# Patient Record
Sex: Male | Born: 1980 | Race: White | Hispanic: No | Marital: Single | Smoking: Never smoker
Health system: Southern US, Community
[De-identification: ages and names within clinical notes are randomized; demographics above are authoritative.]

## PROBLEM LIST (undated history)

## (undated) DIAGNOSIS — Z8619 Personal history of other infectious and parasitic diseases: Secondary | ICD-10-CM

## (undated) HISTORY — DX: Personal history of other infectious and parasitic diseases: Z86.19

## (undated) HISTORY — PX: NO PAST SURGERIES: SHX2092

---

## 2012-10-28 ENCOUNTER — Ambulatory Visit: Payer: Self-pay | Admitting: Family Medicine

## 2012-12-25 ENCOUNTER — Encounter: Payer: Self-pay | Admitting: Family Medicine

## 2012-12-25 ENCOUNTER — Ambulatory Visit (INDEPENDENT_AMBULATORY_CARE_PROVIDER_SITE_OTHER): Payer: Managed Care, Other (non HMO) | Admitting: Family Medicine

## 2012-12-25 VITALS — BP 122/78 | HR 64 | Temp 98.6°F | Ht 71.0 in | Wt 212.8 lb

## 2012-12-25 DIAGNOSIS — Z113 Encounter for screening for infections with a predominantly sexual mode of transmission: Secondary | ICD-10-CM

## 2012-12-25 DIAGNOSIS — R1031 Right lower quadrant pain: Secondary | ICD-10-CM

## 2012-12-25 DIAGNOSIS — Z0000 Encounter for general adult medical examination without abnormal findings: Secondary | ICD-10-CM | POA: Insufficient documentation

## 2012-12-25 LAB — BASIC METABOLIC PANEL
BUN: 21 mg/dL (ref 6–23)
CO2: 32 mEq/L (ref 19–32)
Chloride: 105 mEq/L (ref 96–112)
Creatinine, Ser: 1.2 mg/dL (ref 0.4–1.5)
Glucose, Bld: 105 mg/dL — ABNORMAL HIGH (ref 70–99)

## 2012-12-25 LAB — LIPID PANEL
Cholesterol: 99 mg/dL (ref 0–200)
HDL: 33.9 mg/dL — ABNORMAL LOW (ref >39.00)
Total CHOL/HDL Ratio: 3
Triglycerides: 57 mg/dL (ref 0.0–149.0)

## 2012-12-25 LAB — POCT URINALYSIS DIPSTICK
Glucose, UA: NEGATIVE
Ketones, UA: NEGATIVE
Leukocytes, UA: NEGATIVE

## 2012-12-25 NOTE — Progress Notes (Signed)
Subjective:    Patient ID: Omar Graham, male    DOB: 09-May-1981, 32 y.o.   MRN: [id number]  HPI CC: new pt to establish, would like CPE  Some lower abd discomfort on right, intermittent.  Over last 1 month noticed this.  Denies recent straining.  Does heavy lifting at work.  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 No radiation of pain.  Denies bulge.  No aggravating or alleviating factors noted.  Currently no pain.  Currently sexually active - 3 partners in the last year.  Not using 100% protection.  Denies urethral discharge, dysuria or other urinary symptoms.  Fiancee on birth control.  Requests STD testing today.  Preventative: Fasting today.   Thinks Tdap a few years ago  Sunscreen use discussed. Seatbelt use discussed.  Lives with fiance, no pets Occupation: Engineer, agricultural, receiving Edu: master's Activity: no regular exercise, was going to gym Diet: good water, fruits/vegetables daily  Medications and allergies reviewed and updated in chart.  Past histories reviewed and updated if relevant as below. There are no active problems to display for this patient.  Past Medical History  Diagnosis Date  . History of chicken pox    Past Surgical History  Procedure Laterality Date  . No past surgeries     History  Substance Use Topics  . Smoking status: Never Smoker   . Smokeless tobacco: Never Used  . Alcohol Use: Yes     Comment: Occasional   Family History  Problem Relation Age of Onset  . Cancer Paternal Grandmother     breast  . Cancer Maternal Aunt     breast  . Cancer Paternal Uncle     unsure type  . Diabetes Neg Hx   . Hypertension Neg Hx   . CAD Neg Hx   . Stroke Neg Hx    No Known Allergies No current outpatient prescriptions on file prior to visit.   No current facility-administered medications on file prior to visit.     Review of Systems  Constitutional: Negative for fever, chills, activity change, appetite change, fatigue and unexpected weight  change.  HENT: Negative for hearing loss and neck pain.   Eyes: Negative for visual disturbance.  Respiratory: Negative for cough, chest tightness, shortness of breath and wheezing.   Cardiovascular: Negative for chest pain, palpitations and leg swelling.  Gastrointestinal: Positive for abdominal pain (RLQ pain). Negative for nausea, vomiting, diarrhea, constipation, blood in stool and abdominal distention.  Genitourinary: Negative for hematuria and difficulty urinating.  Musculoskeletal: Negative for myalgias and arthralgias.  Skin: Negative for rash.  Neurological: Negative for dizziness, seizures, syncope and headaches.  Hematological: Negative for adenopathy. Does not bruise/bleed easily.  Psychiatric/Behavioral: Negative for dysphoric mood. The patient is not nervous/anxious.        Objective:   Physical Exam  Nursing note and vitals reviewed. Constitutional: He is oriented to person, place, and time. He appears well-developed and well-nourished. No distress.  HENT:  Head: Normocephalic and atraumatic.  Nose: Nose normal.  Mouth/Throat: Oropharynx is clear and moist. No oropharyngeal exudate.  Eyes: Conjunctivae and EOM are normal. Pupils are equal, round, and reactive to light. No scleral icterus.  Neck: Normal range of motion. Neck supple. No thyromegaly present.  Cardiovascular: Normal rate, regular rhythm, normal heart sounds and intact distal pulses.   No murmur heard. Pulses:      Radial pulses are 2+ on the right side, and 2+ on the left side.  Pulmonary/Chest: Effort normal and breath sounds normal. No respiratory  distress. He has no wheezes. He has no rales.  Abdominal: Soft. Bowel sounds are normal. He exhibits no distension and no mass. There is no tenderness. There is no rebound and no guarding. Hernia confirmed negative in the right inguinal area and confirmed negative in the left inguinal area.  Genitourinary: Penis normal. Right testis shows no mass, no swelling and  no tenderness. Right testis is descended. Left testis shows mass (nontender swelling of epididymis on left). Left testis shows no swelling and no tenderness. Left testis is descended. Uncircumcised.  Musculoskeletal: Normal range of motion. He exhibits no edema.  Lymphadenopathy:    He has no cervical adenopathy.       Right: No inguinal adenopathy present.       Left: Inguinal adenopathy present.  Neurological: He is alert and oriented to person, place, and time.  CN grossly intact, station and gait intact  Skin: Skin is warm and dry. No rash noted.  Psychiatric: He has a normal mood and affect. His behavior is normal. Judgment and thought content normal.      Assessment & Plan:

## 2012-12-25 NOTE — Patient Instructions (Addendum)
Check with health department to see what shot it was and when it was given - then let me know. (check if tetanus if it was Tdap or Td). Blood work and urine checked today. Good to meet you today, call us with questions. Return as needed or in 2-3 for another physical.

## 2012-12-26 LAB — HIV ANTIBODY (ROUTINE TESTING W REFLEX): HIV: NONREACTIVE

## 2012-12-26 LAB — RPR

## 2012-12-27 DIAGNOSIS — R1031 Right lower quadrant pain: Secondary | ICD-10-CM | POA: Insufficient documentation

## 2012-12-27 NOTE — Assessment & Plan Note (Addendum)
Preventative protocols reviewed and updated unless pt declined. Discussed healthy diet and lifestyle.  STD screen today. Pt will check on Tdap status (thinks received at health department)

## 2012-12-27 NOTE — Assessment & Plan Note (Addendum)
RLQ/R groin discomfort along with possible L inguinal lAD and nontender swelling of L epididymis. Suspicious for epididymitis - will check STDs, UA, UCx and consider empiric cipro course to treat possible epididymitis. However, will await STD screen. UA today with moderate blood, but micro reassuring.

## 2012-12-29 LAB — GC/CHLAMYDIA PROBE AMP, URINE: GC Probe Amp, Urine: NEGATIVE

## 2013-06-01 ENCOUNTER — Telehealth: Payer: Self-pay | Admitting: Family Medicine

## 2013-06-01 NOTE — Telephone Encounter (Signed)
Left v/m for pt to cb. 

## 2013-06-01 NOTE — Telephone Encounter (Signed)
Patient called with c/o chest pain.  No answer with return call from RN.

## 2013-06-02 NOTE — Telephone Encounter (Signed)
Has appt for tomorrow

## 2013-06-03 ENCOUNTER — Ambulatory Visit (INDEPENDENT_AMBULATORY_CARE_PROVIDER_SITE_OTHER): Payer: Managed Care, Other (non HMO) | Admitting: Family Medicine

## 2013-06-03 ENCOUNTER — Encounter: Payer: Self-pay | Admitting: Family Medicine

## 2013-06-03 VITALS — BP 110/74 | HR 64 | Temp 98.6°F | Wt 223.0 lb

## 2013-06-03 DIAGNOSIS — R0789 Other chest pain: Secondary | ICD-10-CM | POA: Insufficient documentation

## 2013-06-03 DIAGNOSIS — M940 Chondrocostal junction syndrome [Tietze]: Secondary | ICD-10-CM

## 2013-06-03 MED ORDER — NAPROXEN 500 MG PO TABS: ORAL_TABLET | ORAL | Status: DC

## 2013-06-03 NOTE — Progress Notes (Signed)
Pre-visit discussion using our clinic review tool. No additional management support is needed unless otherwise documented below in the visit note.  

## 2013-06-03 NOTE — Assessment & Plan Note (Signed)
Correlating to recent increase in exercise program.  Exam and story consistent with constochondritis. Discussed treatment of this - recommended back off weights during exercise routine, and will treat with ice/NSAID. Update if not improving with treatment.

## 2013-06-03 NOTE — Patient Instructions (Signed)
You have costochondritis of your left chest wall.  Treat with ice, anti inflammatory, and rest the chest. If not improving with this, let me know.  Costochondritis Costochondritis (Tietze syndrome), or costochondral separation, is a swelling and irritation (inflammation) of the tissue (cartilage) that connects your ribs with your breastbone (sternum). It may occur on its own (spontaneously), through damage caused by an accident (trauma), or simply from coughing or minor exercise. It may take up to 6 weeks to get better and longer if you are unable to be conservative in your activities. HOME CARE INSTRUCTIONS   Avoid exhausting physical activity. Try not to strain your ribs during normal activity. This would include any activities using chest, belly (abdominal), and side muscles, especially if heavy weights are used.  Use ice for 15-20 minutes per hour while awake for the first 2 days. Place the ice in a plastic bag, and place a towel between the bag of ice and your skin.  Only take over-the-counter or prescription medicines for pain, discomfort, or fever as directed by your caregiver. SEEK IMMEDIATE MEDICAL CARE IF:   Your pain increases or you are very uncomfortable.  You have a fever.  You develop difficulty with your breathing.  You cough up blood.  You develop worse chest pains, shortness of breath, sweating, or vomiting.  You develop new, unexplained problems (symptoms). MAKE SURE YOU:   Understand these instructions.  Will watch your condition.  Will get help right away if you are not doing well or get worse. Document Released: 03/21/2005 Document Revised: 09/03/2011 Document Reviewed: 01/13/2013 [facility] Patient Information 2014 [place], [place].

## 2013-06-03 NOTE — Progress Notes (Signed)
   Subjective:    Patient ID: Omar Graham, male    DOB: 1981-01-29, 32 y.o.   MRN: [id number]  HPI CC: chest pain  Notices worse when sitting down - substernal that travels down left inferior chest wall, last minutes.  Describes dull achey strain.  No pressure/tightness.  Ongoing for last 2 weeks (since he started working out).  Not progressively worsening but becoming more frequent.  Not noticed when exercising (cardio with 21 day fit program, started after thanksgiving).  Noticed mainly when sitting and resting, as well as when driving or sitting at work.  Reproducible with chest pressure when he has pain. No nausea, dyspnea.  No palpitations. So far has tried nothing for this. Denies heartburn Has noticed more prevalent when he started working out. Now that he things of it, one specific exercise he has been doing may be causing this pain - lat dorsi with 10lb weight.  Past Medical History  Diagnosis Date  . History of chicken pox      Review of Systems Per HPI    Objective:   Physical Exam  Nursing note and vitals reviewed. Constitutional: He appears well-developed and well-nourished. No distress.  HENT:  Mouth/Throat: Oropharynx is clear and moist. No oropharyngeal exudate.  Cardiovascular: Normal rate, regular rhythm, normal heart sounds and intact distal pulses.   No murmur heard. Pulmonary/Chest: Effort normal and breath sounds normal. No respiratory distress. He has no wheezes. He has no rales. He exhibits tenderness.    Reproducible tenderness to palpation at left 2nd and 3rd costochondral junction       Assessment & Plan:

## 2013-07-13 ENCOUNTER — Encounter: Payer: Self-pay | Admitting: Family Medicine

## 2013-07-13 ENCOUNTER — Ambulatory Visit (INDEPENDENT_AMBULATORY_CARE_PROVIDER_SITE_OTHER): Payer: Managed Care, Other (non HMO) | Admitting: Family Medicine

## 2013-07-13 VITALS — BP 120/88 | HR 76 | Temp 98.5°F | Wt 229.8 lb

## 2013-07-13 DIAGNOSIS — R079 Chest pain, unspecified: Secondary | ICD-10-CM

## 2013-07-13 DIAGNOSIS — R0789 Other chest pain: Secondary | ICD-10-CM

## 2013-07-13 MED ORDER — CYCLOBENZAPRINE HCL 10 MG PO TABS: 10.0000 mg | ORAL_TABLET | Freq: Two times a day (BID) | ORAL | Status: DC | PRN

## 2013-07-13 MED ORDER — DICLOFENAC SODIUM 1 % TD GEL: 1.0000 "application " | Freq: Three times a day (TID) | TRANSDERMAL | Status: DC

## 2013-07-13 MED ORDER — NAPROXEN 500 MG PO TABS: 500.0000 mg | ORAL_TABLET | Freq: Two times a day (BID) | ORAL | Status: DC | PRN

## 2013-07-13 NOTE — Progress Notes (Signed)
   Subjective:    Patient ID: Omar Graham, male    DOB: 07-Aug-1980, 33 y.o.   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  Treated with rest, naprosyn 500mg  bid, and ice.  Continued chest pain.  Naprosyn hasn't really helped (took twice daily for 5 days).  Icing hasn't helped either.  Mainly comes on at rest or when driving, substernal.  Has stopped all upper body workouts, only exercising on treadmill. Describes constant ache with soreness to palpation "like a knot" substernally  Denies dizziness, dyspnea, palpitations, nausea, sweating.  No fevers/chills, other joint pains or rashes noted.  Past Medical History  Diagnosis Date  . History of chicken pox     Family History  Problem Relation Age of Onset  . Cancer Paternal Grandmother     breast  . Cancer Maternal Aunt     breast  . Cancer Paternal Uncle     unsure type  . Diabetes Neg Hx   . Hypertension Neg Hx   . CAD Neg Hx   . Stroke Neg Hx    Review of Systems Per HPI    Objective:   Physical Exam  Nursing note and vitals reviewed. Constitutional: He appears well-developed and well-nourished. No distress.  Cardiovascular: Normal rate, regular rhythm, normal heart sounds and intact distal pulses.   No murmur heard. Pulmonary/Chest: Effort normal and breath sounds normal. No respiratory distress. He has no wheezes. He has no rales. He exhibits tenderness.    Predominant tenderness at body of sternum, not at costochondral joints  Musculoskeletal: He exhibits no edema.       Assessment & Plan:

## 2013-07-13 NOTE — Progress Notes (Signed)
Pre-visit discussion using our clinic review tool. No additional management support is needed unless otherwise documented below in the visit note.  

## 2013-07-13 NOTE — Patient Instructions (Signed)
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  Treat with prolonged naprosyn course (for 2 weeks), may use muscle realxant as needed, and use topical anti inflammatory I also want to refer you to physical therapy to work on an eventual home exercise program. Pass by Marion's office to set this up. Call me in 2 weeks for an update on your pain, if persistent, let me know for referral to a sports medicine doctor.

## 2013-07-13 NOTE — Assessment & Plan Note (Addendum)
?  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 Failed short naprosyn course, ice, rest. Will prolong naprosyn course, prescribe voltaren gel and flexeril muscle relaxant, and refer to PT. Pt agrees with plan and will update me with effect of this treatment.

## 2013-11-19 ENCOUNTER — Encounter: Payer: Self-pay | Admitting: Family Medicine

## 2013-11-19 ENCOUNTER — Ambulatory Visit (INDEPENDENT_AMBULATORY_CARE_PROVIDER_SITE_OTHER): Payer: Managed Care, Other (non HMO) | Admitting: Family Medicine

## 2013-11-19 VITALS — BP 118/80 | HR 66 | Temp 98.4°F | Wt 224.0 lb

## 2013-11-19 DIAGNOSIS — R519 Headache, unspecified: Secondary | ICD-10-CM | POA: Insufficient documentation

## 2013-11-19 DIAGNOSIS — R51 Headache: Secondary | ICD-10-CM

## 2013-11-19 NOTE — Progress Notes (Signed)
Pre visit review using our clinic review tool, if applicable. No additional management support is needed unless otherwise documented below in the visit note. 

## 2013-11-19 NOTE — Assessment & Plan Note (Signed)
nonfocal neurological exam. ?TTH. Advised have vision checked again by eye doctor to help r/o eye etiology - ?copper wire vessels although no h/o HTN. rec stop NSAIDs, trial of flexeril for headache. If eye exam unrevealing and persistent headaches, pt will update me , consider blood work for further evaluation.

## 2013-11-19 NOTE — Progress Notes (Signed)
BP 118/80  Pulse 66  Temp(Src) 98.4 F (36.9 C) (Tympanic)  Wt 224 lb (101.606 kg)  SpO2 98%   CC: headache Subjective:    Patient ID: Omar Graham, male    DOB: 1980-11-02, 33 y.o.   MRN: [id number]  HPI: Omar Graham is a 33 y.o. male presenting on 11/19/2013 for Headache   Over last week noticing increasing headaches - described as throbbing pounding bitemporal headache.  Present in the mornings but tend to progressively get worse as day progresses. Does endorse some lightheadedness when bends. At its worse HA 7/10, but start at 4/10.  Can last 4-6 hours. Currently no headache but feels one coming. Denies vision changes, dizziness, dyspnea, palpitations, nausea, significant photo/phonophobia. No significant analgesic use prior to headaches starting.  Now taking 2 OTC ibuprofen daily which mildly helps. Laying down helps pain. ?allergy related - but still has headache when doesn't have significant. Denies food triggers. Stress level increased last week - but this week doing well at work.  Saw eye doctor 03/2013 - new glasses because tends to get headaches when working on computer. Denies blurry vision.  No other h/o headaches or migraines in the past.  No fmhx migraines. No significant caffeine or soda use.    Chest pain comes and goes - thought Tietze syndrome.  Not needing previously prescribed meds.  Relevant past medical, surgical, family and social history reviewed and updated as indicated.  Allergies and medications reviewed and updated. Current Outpatient Prescriptions on File Prior to Visit  Medication Sig  . cyclobenzaprine (FLEXERIL) 10 MG tablet Take 1 tablet (10 mg total) by mouth 2 (two) times daily as needed for muscle spasms (sedation precautions).  . diclofenac sodium (VOLTAREN) 1 % GEL Apply 1 application topically 3 (three) times daily.  . naproxen (NAPROSYN) 500 MG tablet Take 1 tablet (500 mg total) by mouth 2 (two) times daily as needed for moderate  pain.   No current facility-administered medications on file prior to visit.    Review of Systems Per HPI unless specifically indicated above    Objective:    BP 118/80  Pulse 66  Temp(Src) 98.4 F (36.9 C) (Tympanic)  Wt 224 lb (101.606 kg)  SpO2 98%  Physical Exam  Nursing note and vitals reviewed. Constitutional: He is oriented to person, place, and time. He appears well-developed and well-nourished. No distress.  HENT:  Head: Normocephalic and atraumatic.  Right Ear: Hearing, tympanic membrane, external ear and ear canal normal.  Left Ear: Hearing, tympanic membrane, external ear and ear canal normal.  Nose: Nose normal. No mucosal edema or rhinorrhea. Right sinus exhibits no maxillary sinus tenderness and no frontal sinus tenderness. Left sinus exhibits no maxillary sinus tenderness and no frontal sinus tenderness.  Mouth/Throat: Uvula is midline, oropharynx is clear and moist and mucous membranes are normal. No oropharyngeal exudate, posterior oropharyngeal edema, posterior oropharyngeal erythema or tonsillar abscesses.  Eyes: Conjunctivae and EOM are normal. Pupils are equal, round, and reactive to light. No scleral icterus.  ?copper wiring of retinal vessels bilaterally Unable to r/o papilledema with limited fundoscopic exam today.  Neck: Normal range of motion. Neck supple. No thyromegaly present.  Cardiovascular: Normal rate, regular rhythm, normal heart sounds and intact distal pulses.   No murmur heard. Pulmonary/Chest: Effort normal and breath sounds normal. No respiratory distress. He has no wheezes. He has no rales.  Lymphadenopathy:    He has no cervical adenopathy.  Neurological: He is alert and oriented to person, place, and  time. He has normal strength. No cranial nerve deficit or sensory deficit. He displays a negative Romberg sign. Coordination and gait normal.  CN 2-12 intact Station and gait intact Intact FTN, HTS No dysdiadochokinesia Neg pronator drift    Skin: Skin is warm and dry. No rash noted.       Assessment & Plan:   Problem List Items Addressed This Visit   Headache(784.0) - Primary     nonfocal neurological exam. ?TTH. Advised have vision checked again by eye doctor to help r/o eye etiology - ?copper wire vessels although no h/o HTN. rec stop NSAIDs, trial of flexeril for headache. If eye exam unrevealing and persistent headaches, pt will update me , consider blood work for further evaluation.        Follow up plan: Return if symptoms worsen or fail to improve.

## 2013-11-19 NOTE — Patient Instructions (Addendum)
Recheck with eye doctor to ensure it's not an eye issue leading to headache. Avoid ibuprofen for now - use flexeril for headaches. Try to keep a headache diary - what foods you ate or what your were doing prior to headache. If eyes check out ok, and headache persists, call me.  Tension Headache A tension headache is a feeling of pain, pressure, or aching often felt over the front and sides of the head. The pain can be dull or can feel tight (constricting). It is the most common type of headache. Tension headaches are not normally associated with nausea or vomiting and do not get worse with physical activity. Tension headaches can last 30 minutes to several days.  CAUSES  The exact cause is not known, but it may be caused by chemicals and hormones in the brain that lead to pain. Tension headaches often begin after stress, anxiety, or depression. Other triggers may include:  Alcohol.  Caffeine (too much or withdrawal).  Respiratory infections (colds, flu, sinus infections).  Dental problems or teeth clenching.  Fatigue.  Holding your head and neck in one position too long while using a computer. SYMPTOMS   Pressure around the head.   Dull, aching head pain.   Pain felt over the front and sides of the head.   Tenderness in the muscles of the head, neck, and shoulders. DIAGNOSIS  A tension headache is often diagnosed based on:   Symptoms.   Physical examination.   A CT scan or MRI of your head. These tests may be ordered if symptoms are severe or unusual. TREATMENT  Medicines may be given to help relieve symptoms.  HOME CARE INSTRUCTIONS   Only take over-the-counter or prescription medicines for pain or discomfort as directed by your caregiver.   Lie down in a dark, quiet room when you have a headache.   Keep a journal to find out what may be triggering your headaches. For example, write down:  What you eat and drink.  How much sleep you get.  Any change to your  diet or medicines.  Try massage or other relaxation techniques.   Ice packs or heat applied to the head and neck can be used. Use these 3 to 4 times per day for 15 to 20 minutes each time, or as needed.   Limit stress.   Sit up straight, and do not tense your muscles.   Quit smoking if you smoke.  Limit alcohol use.  Decrease the amount of caffeine you drink, or stop drinking caffeine.  Eat and exercise regularly.  Get 7 to 9 hours of sleep, or as recommended by your caregiver.  Avoid excessive use of pain medicine as recurrent headaches can occur.  SEEK MEDICAL CARE IF:   You have problems with the medicines you were prescribed.  Your medicines do not work.  You have a change from the usual headache.  You have nausea or vomiting. SEEK IMMEDIATE MEDICAL CARE IF:   Your headache becomes severe.  You have a fever.  You have a stiff neck.  You have loss of vision.  You have muscular weakness or loss of muscle control.  You lose your balance or have trouble walking.  You feel faint or pass out.  You have severe symptoms that are different from your first symptoms. MAKE SURE YOU:   Understand these instructions.  Will watch your condition.  Will get help right away if you are not doing well or get worse. Document Released: 06/11/2005 Document  Revised: 09/03/2011 Document Reviewed: 06/01/2011 [facility] Patient Information 2014 [place], [place].

## 2013-12-03 ENCOUNTER — Other Ambulatory Visit: Payer: Self-pay | Admitting: Family Medicine

## 2013-12-04 MED ORDER — CYCLOBENZAPRINE HCL 10 MG PO TABS: 10.0000 mg | ORAL_TABLET | Freq: Two times a day (BID) | ORAL | Status: DC | PRN

## 2013-12-04 NOTE — Telephone Encounter (Signed)
Last office visit 11/19/2013.  Last refilled 07/13/2013 for #30.  Ok to refill?

## 2014-08-16 ENCOUNTER — Encounter: Payer: Self-pay | Admitting: Family Medicine

## 2014-11-30 ENCOUNTER — Encounter: Payer: Self-pay | Admitting: Family Medicine

## 2014-12-03 ENCOUNTER — Ambulatory Visit (INDEPENDENT_AMBULATORY_CARE_PROVIDER_SITE_OTHER): Payer: Managed Care, Other (non HMO) | Admitting: Family Medicine

## 2014-12-03 ENCOUNTER — Encounter: Payer: Self-pay | Admitting: Family Medicine

## 2014-12-03 ENCOUNTER — Ambulatory Visit (INDEPENDENT_AMBULATORY_CARE_PROVIDER_SITE_OTHER)
Admission: RE | Admit: 2014-12-03 | Discharge: 2014-12-03 | Disposition: A | Discharge status: Home / Self Care | Payer: Managed Care, Other (non HMO) | Source: Ambulatory Visit | Attending: Family Medicine | Admitting: Family Medicine

## 2014-12-03 VITALS — BP 112/90 | HR 76 | Temp 99.1°F | Ht 71.0 in | Wt 231.5 lb

## 2014-12-03 DIAGNOSIS — R079 Chest pain, unspecified: Secondary | ICD-10-CM

## 2014-12-03 DIAGNOSIS — Z13220 Encounter for screening for lipoid disorders: Secondary | ICD-10-CM

## 2014-12-03 DIAGNOSIS — R0789 Other chest pain: Secondary | ICD-10-CM

## 2014-12-03 DIAGNOSIS — Z0000 Encounter for general adult medical examination without abnormal findings: Secondary | ICD-10-CM | POA: Diagnosis not present

## 2014-12-03 MED ORDER — PREDNISONE 20 MG PO TABS: ORAL_TABLET | ORAL | Status: DC

## 2014-12-03 NOTE — Progress Notes (Signed)
BP 112/90 mmHg  Pulse 76  Temp(Src) 99.1 F (37.3 C) (Oral)  Ht _0  (1.803 m)  Wt 231 lb 8 oz (105.008 kg)  BMI 32.30 kg/m2   CC: CPE  Subjective:    Patient ID: Omar Graham, male    DOB: August 19, 1980, 34 y.o.   MRN: [id number]  HPI: Armistead Sult is a 34 y.o. male presenting on 12/03/2014 for Annual Exam   Prior dx sternalis syndrome treated with rest, naprosyn, voltaren gel and flexeril muscle relaxant. Also referred to PT (06/2013) - actually didn't go.   Over last 3 months chest pain returning, worse daily over last week, today pain all day. No recent weight lifting. Feels like chest wall strain. Tried voltaren gel which helped. Flexeril hasn't really helped. Occasional dyspnea. No redness or warmth or swelling of sternum.  No inciting trauma or injury over the years. No other joints involved.   No fhmx CAD. No exertional pain, no nausea. No radiation of pain. Worse with leaning forward. Better with laying supine.   Increased stress last few weeks - at work. Works at desk all day. No GERD sxs.   Preventative: Thinks Tdap a few years ago.  Seatbelt use discussed.  No changing moles on skin.   Lives with wife, expecting 1st child, 1 dog  Occupation: Nurse, learning disability, receiving Edu: master's Activity: no regular exercise, was going to gym Diet: good water, fruits/vegetables daily  Relevant past medical, surgical, family and social history reviewed and updated as indicated. Interim medical history since our last visit reviewed. Allergies and medications reviewed and updated. Current Outpatient Prescriptions on File Prior to Visit  Medication Sig  . cyclobenzaprine (FLEXERIL) 10 MG tablet Take 1 tablet (10 mg total) by mouth 2 (two) times daily as needed for muscle spasms (sedation precautions). (Patient not taking: Reported on 12/03/2014)  . diclofenac sodium (VOLTAREN) 1 % GEL Apply 1 application topically 3 (three) times daily. (Patient not taking: Reported on  12/03/2014)  . naproxen (NAPROSYN) 500 MG tablet Take 1 tablet (500 mg total) by mouth 2 (two) times daily as needed for moderate pain. (Patient not taking: Reported on 12/03/2014)   No current facility-administered medications on file prior to visit.    Review of Systems  Constitutional: Negative for fever, chills, activity change, appetite change, fatigue and unexpected weight change.  HENT: Negative for hearing loss.   Eyes: Negative for visual disturbance.  Respiratory: Positive for chest tightness and shortness of breath. Negative for cough and wheezing.   Cardiovascular: Positive for chest pain. Negative for palpitations and leg swelling.  Gastrointestinal: Negative for nausea, vomiting, abdominal pain, diarrhea, constipation, blood in stool and abdominal distention.  Genitourinary: Negative for hematuria and difficulty urinating.  Musculoskeletal: Negative for myalgias, arthralgias and neck pain.  Skin: Negative for rash.  Neurological: Negative for dizziness, seizures, syncope and headaches.  Hematological: Negative for adenopathy. Does not bruise/bleed easily.  Psychiatric/Behavioral: Negative for dysphoric mood. The patient is not nervous/anxious.    Per HPI unless specifically indicated above     Objective:    BP 112/90 mmHg  Pulse 76  Temp(Src) 99.1 F (37.3 C) (Oral)  Ht _1  (1.803 m)  Wt 231 lb 8 oz (105.008 kg)  BMI 32.30 kg/m2  Wt Readings from Last 3 Encounters:  12/03/14 231 lb 8 oz (105.008 kg)  11/19/13 224 lb (101.606 kg)  07/13/13 229 lb 12 oz (104.214 kg)    Physical Exam  Constitutional: He is oriented to person, place, and  time. He appears well-developed and well-nourished. No distress.  HENT:  Head: Normocephalic and atraumatic.  Right Ear: Hearing, tympanic membrane, external ear and ear canal normal.  Left Ear: Hearing, tympanic membrane, external ear and ear canal normal.  Nose: Nose normal.  Mouth/Throat: Uvula is midline, oropharynx is clear  and moist and mucous membranes are normal. No oropharyngeal exudate, posterior oropharyngeal edema or posterior oropharyngeal erythema.  Eyes: Conjunctivae and EOM are normal. Pupils are equal, round, and reactive to light. No scleral icterus.  Neck: Normal range of motion. Neck supple. No thyromegaly present.  Cardiovascular: Normal rate, regular rhythm, normal heart sounds and intact distal pulses.   No murmur heard. Pulses:      Radial pulses are 2+ on the right side, and 2+ on the left side.  Pulmonary/Chest: Effort normal and breath sounds normal. No respiratory distress. He has no wheezes. He has no rales. He exhibits tenderness.  Tender to palpation along sternum, not at manubrium or at xyphoid process, radiation of pain laterally No swelling appreciated  Abdominal: Soft. Bowel sounds are normal. He exhibits no distension and no mass. There is no tenderness. There is no rebound and no guarding.  Musculoskeletal: Normal range of motion. He exhibits no edema.  Lymphadenopathy:    He has no cervical adenopathy.  Neurological: He is alert and oriented to person, place, and time.  CN grossly intact, station and gait intact  Skin: Skin is warm and dry. No rash noted.  Psychiatric: He has a normal mood and affect. His behavior is normal. Judgment and thought content normal.  Nursing note and vitals reviewed.     Assessment & Plan:   Problem List Items Addressed This Visit    Healthcare maintenance - Primary    Preventative protocols reviewed and updated unless pt declined. Discussed healthy diet and lifestyle.       Sternum pain    Again ?recurrent sternalis syndrome - will refer for PT, treat with prednisone course + voltaren, rec OTC analgesic patch during the day. Check ESR to eval for inflammatory arthritis. Pt agrees with plan.      Relevant Orders   Ambulatory referral to Physical Therapy   DG Sternum   Sedimentation rate    Other Visit Diagnoses    Lipid screening          Relevant Orders    Basic metabolic panel    Lipid panel        Follow up plan: Return in about 1 year (around 12/03/2015), or if symptoms worsen or fail to improve, for annual exam, prior fasting for blood work.

## 2014-12-03 NOTE — Assessment & Plan Note (Signed)
Again ?recurrent sternalis syndrome - will refer for PT, treat with prednisone course + voltaren, rec OTC analgesic patch during the day. Check ESR to eval for inflammatory arthritis. Pt agrees with plan.

## 2014-12-03 NOTE — Patient Instructions (Addendum)
labwork today Xray today We will try physical therapy. I think you have recurrent sternal pain - treat with prednisone course and continue voltaren gel. Try over the counter analgesic patch like salon pas for during the day Let us know if not improving with treatment. Congratulations for baby!

## 2014-12-03 NOTE — Assessment & Plan Note (Signed)
Preventative protocols reviewed and updated unless pt declined. Discussed healthy diet and lifestyle.  

## 2014-12-03 NOTE — Progress Notes (Signed)
Pre visit review using our clinic review tool, if applicable. No additional management support is needed unless otherwise documented below in the visit note. 

## 2014-12-22 LAB — LIPID PANEL
CHOL/HDL RATIO: 3.2 ratio (ref 0.0–5.0)
Cholesterol, Total: 120 mg/dL (ref 100–199)
HDL: 38 mg/dL — ABNORMAL LOW (ref >39)
LDL Calculated: 63 mg/dL (ref 0–99)
Triglycerides: 94 mg/dL (ref 0–149)
VLDL CHOLESTEROL CAL: 19 mg/dL (ref 5–40)

## 2014-12-22 LAB — SEDIMENTATION RATE: Sed Rate: 8 mm/hr (ref 0–15)

## 2014-12-22 LAB — BASIC METABOLIC PANEL
BUN/Creatinine Ratio: 15 (ref 8–19)
BUN: 16 mg/dL (ref 6–20)
CALCIUM: 9.1 mg/dL (ref 8.7–10.2)
CO2: 23 mmol/L (ref 18–29)
CREATININE: 1.05 mg/dL (ref 0.76–1.27)
Chloride: 100 mmol/L (ref 97–108)
GFR calc Af Amer: 107 mL/min/{1.73_m2} (ref >59)
GFR calc non Af Amer: 92 mL/min/{1.73_m2} (ref >59)
GLUCOSE: 116 mg/dL — AB (ref 65–99)
POTASSIUM: 4.1 mmol/L (ref 3.5–5.2)
SODIUM: 138 mmol/L (ref 134–144)

## 2015-01-07 ENCOUNTER — Encounter: Payer: Self-pay | Admitting: Family Medicine

## 2015-01-07 ENCOUNTER — Ambulatory Visit (INDEPENDENT_AMBULATORY_CARE_PROVIDER_SITE_OTHER): Payer: Managed Care, Other (non HMO) | Admitting: Family Medicine

## 2015-01-07 ENCOUNTER — Telehealth: Payer: Self-pay | Admitting: Family Medicine

## 2015-01-07 VITALS — BP 130/92 | HR 77 | Temp 98.2°F | Wt 231.5 lb

## 2015-01-07 DIAGNOSIS — R0789 Other chest pain: Secondary | ICD-10-CM

## 2015-01-07 DIAGNOSIS — R079 Chest pain, unspecified: Secondary | ICD-10-CM | POA: Diagnosis not present

## 2015-01-07 MED ORDER — NAPROXEN 500 MG PO TABS: 500.0000 mg | ORAL_TABLET | Freq: Two times a day (BID) | ORAL | Status: DC | PRN

## 2015-01-07 NOTE — Telephone Encounter (Signed)
See below. Per Marcelline MatesWaynetta- Dr. Karie Schwalbe said it would be best if he could see you today since you are going to be gone for the next 2 weeks, unless you think it's okay to wait.

## 2015-01-07 NOTE — Telephone Encounter (Signed)
Patient Name: Omar Graham  DOB: 1981-02-22    Initial Comment caller states he has chest pain on upper left side chest   Nurse Assessment  Nurse: Sherilyn CooterHenry, RN, Thurmond ButtsWade Date/Time Lamount Cohen(Eastern Time): 01/07/2015 10:33:17 AM  Confirm and document reason for call. If symptomatic, describe symptoms. ---Caller states he has chest pain in the upper left of his chest beginning Wednesday. He has had chest pains before and the doctor felt it was a bruised sternum. This is in a different location. Denies injury. He states that it feels like a knot, but he also states that he does not feel a knot under the skin. Denies difficulty breathing. He rates the pain as 3 on 0-10 scale.  Has the patient traveled out of the country within the last 30 days? ---No  Does the patient require triage? ---Yes  Related visit to physician within the last 2 weeks? ---No  Does the PT have any chronic conditions? (i.e. diabetes, asthma, etc.) ---No     Guidelines    Guideline Title Affirmed Question Affirmed Notes  Chest Pain Chest pain lasts > 5 minutes (Exceptions: chest pain occurring > 3 days ago and now asymptomatic; same as previously diagnosed heartburn and has accompanying sour taste in mouth)    Final Disposition User   Go to ED Now (or PCP triage) Sherilyn CooterHenry, RN, Thurmond ButtsWade    Comments  Appointment scheduled for 2:15pm today with Dr. Roxy MannsMarne Tower.   Referrals  REFERRED TO PCP OFFICE   Disagree/Comply: Comply

## 2015-01-07 NOTE — Telephone Encounter (Signed)
He can keep appt for 7/18 but if symptoms worsen or if he wants to be seen earlier please set up with [facility]  If any s/s of angina - go to ED- SOB/ exertional symptoms/sweating/nausea etc

## 2015-01-07 NOTE — Telephone Encounter (Signed)
Contact was made with patient and he was seen at 1:30.

## 2015-01-07 NOTE — Telephone Encounter (Signed)
plz call patient - have him come in today at 1:30pm.

## 2015-01-07 NOTE — Assessment & Plan Note (Addendum)
Anticipate development of chronic sternalis syndrome. Pt previously declined PT twice. Will refer to sports medicine in [place] (where pt works) per pt preference.  Not consistent with pulm, cardiac, GI cause ESR previously normal pointing against autoimmune arthritic condition.

## 2015-01-07 NOTE — Patient Instructions (Addendum)
Pass by up front for referral to Dr Terrilee FilesZach Smith in [place] [facility]. Naprosyn refilled.

## 2015-01-07 NOTE — Progress Notes (Signed)
BP 130/92 mmHg  Pulse 77  Temp(Src) 98.2 F (36.8 C) (Oral)  Wt 231 lb 8 oz (105.008 kg)  SpO2 98%   CC: changing chest pain.  Subjective:    Patient ID: Omar Graham, male    DOB: February 23, 1981, 34 y.o.   MRN: [id number]  HPI: Omar Graham is a 34 y.o. male presenting on 01/07/2015 for Chest Pain   See prior notes for details.  Prior dx sternalis syndrome treated with rest, naprosyn, voltaren gel and flexeril muscle relaxant. Also referred to PT (06/2013, again 11/2014) - didn't go. Last visit we added prednisone taper - also helpful. I thought patient had recurrent sternalis syndrome. ESR was normal. Naprosyn anti inflammatory resolves pain while he takes it.   Now over last 2 days chest pain returning. Describes pinching left upper chest wall pain like knot sensation without physical knot. Worse with standing. Better with laying down.   No radiation of pain. No back pain, abd pain, nausea/vomiting, fevers or chills. No new joint pains.   Works out at gym 2-3x/wk, works on treadmill. No chest pain with this. No weight lifting.   Relevant past medical, surgical, family and social history reviewed and updated as indicated. Interim medical history since our last visit reviewed. Allergies and medications reviewed and updated. Current Outpatient Prescriptions on File Prior to Visit  Medication Sig  . cyclobenzaprine (FLEXERIL) 10 MG tablet Take 1 tablet (10 mg total) by mouth 2 (two) times daily as needed for muscle spasms (sedation precautions). (Patient not taking: Reported on 12/03/2014)  . diclofenac sodium (VOLTAREN) 1 % GEL Apply 1 application topically 3 (three) times daily. (Patient not taking: Reported on 12/03/2014)  . naproxen (NAPROSYN) 500 MG tablet Take 1 tablet (500 mg total) by mouth 2 (two) times daily as needed for moderate pain. (Patient not taking: Reported on 12/03/2014)   No current facility-administered medications on file prior to visit.    Review of  Systems Per HPI unless specifically indicated above     Objective:    BP 130/92 mmHg  Pulse 77  Temp(Src) 98.2 F (36.8 C) (Oral)  Wt 231 lb 8 oz (105.008 kg)  SpO2 98%  Wt Readings from Last 3 Encounters:  01/07/15 231 lb 8 oz (105.008 kg)  12/03/14 231 lb 8 oz (105.008 kg)  11/19/13 224 lb (101.606 kg)    Physical Exam  Constitutional: He appears well-developed and well-nourished. No distress.  Neck: Normal range of motion. Neck supple.  Cardiovascular: Normal rate, regular rhythm, normal heart sounds and intact distal pulses.   No murmur heard. Pulses:      Radial pulses are 2+ on the right side, and 2+ on the left side.  Pulmonary/Chest: Effort normal and breath sounds normal. No respiratory distress. He has no wheezes. He has no rales. He exhibits tenderness.  Tender to palpation along sternum, not at manubrium or at xyphoid process, radiation of pain laterally No swelling appreciated  Musculoskeletal: Normal range of motion. He exhibits no edema.  Lymphadenopathy:    He has no cervical adenopathy.  Neurological:  CN grossly intact, station and gait intact  Skin: Skin is warm and dry. No rash noted.  Psychiatric: He has a normal mood and affect.  Nursing note and vitals reviewed.  Results for orders placed or performed in visit on 40/81/44  Basic metabolic panel  Result Value Ref Range   Glucose 116 (H) 65 - 99 mg/dL   BUN 16 6 - 20 mg/dL   Creatinine,  Ser 1.05 0.76 - 1.27 mg/dL   GFR calc non Af Amer 92 >59 mL/min/1.73   GFR calc Af Amer 107 >59 mL/min/1.73   BUN/Creatinine Ratio 15 8 - 19   Sodium 138 134 - 144 mmol/L   Potassium 4.1 3.5 - 5.2 mmol/L   Chloride 100 97 - 108 mmol/L   CO2 23 18 - 29 mmol/L   Calcium 9.1 8.7 - 10.2 mg/dL  Lipid panel  Result Value Ref Range   Cholesterol, Total 120 100 - 199 mg/dL   Triglycerides 94 0 - 149 mg/dL   HDL 38 (L) >39 mg/dL   VLDL Cholesterol Cal 19 5 - 40 mg/dL   LDL Calculated 63 0 - 99 mg/dL   Chol/HDL Ratio  3.2 0.0 - 5.0 ratio units  Sedimentation rate  Result Value Ref Range   Sed Rate 8 0 - 15 mm/hr   STERNUM - 2+ VIEW COMPARISON: None. FINDINGS: There is no evidence of fracture or other focal bone lesions. IMPRESSION: No acute abnormality noted. Electronically Signed  By: Inez Catalina M.D.  On: 12/03/2014 17:11    Assessment & Plan:   Problem List Items Addressed This Visit    Sternum pain - Primary    Anticipate development of chronic sternalis syndrome. Pt previously declined PT twice. Will refer to sports medicine in [place] (where pt works) per pt preference.  Not consistent with pulm, cardiac, GI cause ESR previously normal pointing against autoimmune arthritic condition.      Relevant Orders   Ambulatory referral to Sports Medicine       Follow up plan: Return if symptoms worsen or fail to improve.

## 2015-01-07 NOTE — Progress Notes (Signed)
Pre visit review using our clinic review tool, if applicable. No additional management support is needed unless otherwise documented below in the visit note. 

## 2015-01-07 NOTE — Telephone Encounter (Signed)
Spoke to Dr Milinda Antisower who states it would be optimal for Dr Reece AgarG to see pt today, as he will be out of the country the next two weeks, but if he believes pt can wait until Monday, ok to leave as is

## 2015-01-07 NOTE — Telephone Encounter (Signed)
Called and had to leave message on VM.

## 2015-01-07 NOTE — Telephone Encounter (Signed)
Under comments in team health note pt has appt today at 2:15 pm. When I looked at appt schedule pt has appt 01/10/15 at 2:15 with Dr Milinda Antisower.Please advise.

## 2015-01-10 ENCOUNTER — Ambulatory Visit (INDEPENDENT_AMBULATORY_CARE_PROVIDER_SITE_OTHER): Payer: Managed Care, Other (non HMO) | Admitting: Family Medicine

## 2015-01-10 ENCOUNTER — Ambulatory Visit: Payer: Self-pay | Admitting: Family Medicine

## 2015-01-10 ENCOUNTER — Encounter: Payer: Self-pay | Admitting: Family Medicine

## 2015-01-10 VITALS — BP 122/100 | HR 75 | Temp 97.8°F | Ht 71.0 in | Wt 230.0 lb

## 2015-01-10 DIAGNOSIS — R0789 Other chest pain: Secondary | ICD-10-CM | POA: Diagnosis not present

## 2015-01-10 DIAGNOSIS — R079 Chest pain, unspecified: Secondary | ICD-10-CM

## 2015-01-10 NOTE — Patient Instructions (Signed)

## 2015-01-10 NOTE — Progress Notes (Signed)
Pre visit review using our clinic review tool, if applicable. No additional management support is needed unless otherwise documented below in the visit note. 

## 2015-01-10 NOTE — Progress Notes (Signed)
Dr. Karleen Hampshire T. Juliani Laduke, MD, CAQ Sports [facility] [address] [place], [address] Phone: [phone] Fax: [phone]  01/10/2015  Patient: Omar Graham, MRN: [id number], DOB: 1981-05-11, 34 y.o.  Primary Physician:  Eustaquio Boyden, MD  Chief Complaint: Chest Pain  Subjective:   Omar Graham is a 34 y.o. very pleasant male patient who presents with the following:  Consulting MD: Dr. 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  He is 34 years old, and generally is quite healthy.  Initially, the patient had been lifting weights quite a bit, and it was felt like he may have had some sternal or rib irritation secondary to weight lifting, but he has not been doing this for many months.  Over the last few days, his pain in his chest has worsened quite a bit, and it still is localized to the junction of the ribs and sternum.  He also has some left-sided pectoralis tenderness currently.  His pain is not pleuritic in nature.  Palpation and movement cause some pain, but he has no pain at all with any type of exertion, exercise, yardwork, or anything with strenuous activity.  He has tried multiple anti-inflammatories, a 7 day prednisone taper, topical anti-inflammatories, topical icy hot, as well as muscle relaxants without much relief.  He denies significant anxiety or panic attacks.  He has had some stress at work recently, but this is relatively recent and not during the duration of the entirety of his chest pain.  He feels like "there is a knot"  Very intermittent.  Felt pain all weekend.  Friday, stood up and felt it.  Not with mowing the yard.   No tobacco, drugs.  No FH of cardiac disease.   Past Medical History, Surgical History, Social History, Family History, Problem List, Medications, and Allergies have been reviewed and  updated if relevant.  Patient Active Problem List   Diagnosis Date Noted  . Headache(784.0) 11/19/2013  . Sternum pain 06/03/2013  . Healthcare maintenance 12/25/2012    Past Medical History  Diagnosis Date  . History of chicken pox     Past Surgical History  Procedure Laterality Date  . No past surgeries      History   Social History  . Marital Status: Single    Spouse Name: N/A  . Number of Children: N/A  . Years of Education: N/A   Occupational History  . Not on file.   Social History Main Topics  . Smoking status: Never Smoker   . Smokeless tobacco: Never Used  . Alcohol Use: Yes     Comment: Occasional  . Drug Use: No  . Sexual Activity: Not on file   Other Topics Concern  . Not on file   Social History Narrative   Lives with wife, expecting 1st child, 1 dog    Occupation: Engineer, agricultural, receiving   Edu: master's   Activity: no regular exercise, was going to gym   Diet: good water, fruits/vegetables daily    Family History  Problem Relation Age of Onset  . Cancer Paternal Grandmother     breast  . Cancer Maternal Aunt     breast  . Cancer Paternal Uncle     unsure type  . Diabetes Neg Hx   . Hypertension Neg Hx   . CAD Neg Hx   .  Stroke Neg Hx     No Known Allergies  Medication list reviewed and updated in full in [facility].  GEN: No fevers, chills. Nontoxic. Primarily MSK c/o today. MSK: Detailed in the HPI GI: tolerating PO intake without difficulty Neuro: No numbness, parasthesias, or tingling associated. Otherwise the pertinent positives of the ROS are noted above.   Objective:   BP 122/100 mmHg  Pulse 75  Temp(Src) 97.8 F (36.6 C) (Oral)  Ht  (1.803 m)  Wt 230 lb (104.327 kg)  BMI 32.09 kg/m2   GEN: WDWN, NAD, Non-toxic, A & O x 3 HEENT: Atraumatic, Normocephalic. Neck supple. No masses, No LAD. Ears and Nose: No external deformity. CV: RRR, No M/G/R. No JVD. No thrill. No extra heart sounds. PULM: CTA  B, no wheezes, crackles, rhonchi. No retractions. No resp. distress. No accessory muscle use. ABD: S, NT, ND, + BS, No rebound, No HSM  EXTR: No c/c/e NEURO Normal gait.  PSYCH: Normally interactive. Conversant. Not depressed or anxious appearing.  Calm demeanor.    Chest wall: nontender along the clavicle.  Nontender at the acromioclavicular joint.  Nontender at the sternoclavicular joint.  Nontender at the xiphoid process.  Tenderness to palpation at the majority of rib articulations at the sternum, particularly in the upper ribs.  The lower 3 rib articulations are nontender to palpation.  The middle portion of the sternum itself was not particularly tender to palpation, and pain centers at the articulation.  Radiology: STERNUM - 2+ VIEW  COMPARISON: None.  FINDINGS: There is no evidence of fracture or other focal bone lesions.  IMPRESSION: No acute abnormality noted.   Electronically Signed  By: Alcide Clever M.D.  On: 12/03/2014 17:11  The radiological images were independently reviewed by myself in the office and results were reviewed with the patient. My independent interpretation of images:  On AP chest, there appears to be no mass apparent.  There is no infiltrate.  No fracture is visualized. Electronically Signed  By: Hannah Beat, MD On: 01/11/2015 7:01 AM   Results for orders placed or performed in visit on 12/03/14  Basic metabolic panel  Result Value Ref Range   Glucose 116 (H) 65 - 99 mg/dL   BUN 16 6 - 20 mg/dL   Creatinine, Ser 1.19 0.76 - 1.27 mg/dL   GFR calc non Af Amer 92 >59 mL/min/1.73   GFR calc Af Amer 107 >59 mL/min/1.73   BUN/Creatinine Ratio 15 8 - 19   Sodium 138 134 - 144 mmol/L   Potassium 4.1 3.5 - 5.2 mmol/L   Chloride 100 97 - 108 mmol/L   CO2 23 18 - 29 mmol/L   Calcium 9.1 8.7 - 10.2 mg/dL  Lipid panel  Result Value Ref Range   Cholesterol, Total 120 100 - 199 mg/dL   Triglycerides 94 0 - 149 mg/dL   HDL 38 (L) >14 mg/dL    VLDL Cholesterol Cal 19 5 - 40 mg/dL   LDL Calculated 63 0 - 99 mg/dL   Chol/HDL Ratio 3.2 0.0 - 5.0 ratio units  Sedimentation rate  Result Value Ref Range   Sed Rate 8 0 - 15 mm/hr     Assessment and Plan:   Other chest pain - Plan: CT Chest W Contrast  Sternal pain - Plan: CT Chest W Contrast  Noncardiac, nonpleuritic sternal chest pain ongoing for 18 months.  Pain is mostly at rib articulation to the sternum. Failure to improve with multiple types of conservative management.  Obtain a CT of the chest with contrast to evaluate for potential bony metastases, primary lung neoplasm, potential fracture or stress fracture of the ribs or sternum to better characterize this area of significant pain ongoing for 18 months without clear source or diagnosis.  This may represent tietze syndrome, which CT would better help characterize, but should be relatively a diagnosis of exclusion.   I appreciate the opportunity to evaluate this very friendly patient. If you have any question regarding her care or prognosis, do not hesitate to ask.   Follow-up: depending on chest CT  Orders Placed This Encounter  Procedures  . CT Chest W Contrast    Signed,  Marvell Stavola T. Kohei Antonellis, MD   Patient's Medications  New Prescriptions   No medications on file  Previous Medications   CYCLOBENZAPRINE (FLEXERIL) 10 MG TABLET    Take 1 tablet (10 mg total) by mouth 2 (two) times daily as needed for muscle spasms (sedation precautions).   DICLOFENAC SODIUM (VOLTAREN) 1 % GEL    Apply 1 application topically 3 (three) times daily.   NAPROXEN (NAPROSYN) 500 MG TABLET    Take 1 tablet (500 mg total) by mouth 2 (two) times daily as needed for moderate pain.  Modified Medications   No medications on file  Discontinued Medications   No medications on file

## 2015-01-13 ENCOUNTER — Ambulatory Visit
Admission: RE | Admit: 2015-01-13 | Discharge: 2015-01-13 | Disposition: A | Discharge status: Home / Self Care | Payer: Managed Care, Other (non HMO) | Source: Ambulatory Visit | Attending: Family Medicine | Admitting: Family Medicine

## 2015-01-13 DIAGNOSIS — R0789 Other chest pain: Secondary | ICD-10-CM

## 2015-01-13 MED ORDER — IOPAMIDOL (ISOVUE-300) INJECTION 61%
75.0000 mL | Freq: Once | INTRAVENOUS | Status: AC | PRN
  Administered 2015-01-13: 75 mL via INTRAVENOUS

## 2015-01-14 ENCOUNTER — Encounter: Payer: Self-pay | Admitting: Family Medicine

## 2015-01-18 ENCOUNTER — Encounter: Payer: Self-pay | Admitting: Family Medicine

## 2015-01-18 MED ORDER — PREDNISONE 20 MG PO TABS: ORAL_TABLET | ORAL | Status: DC

## 2015-05-09 ENCOUNTER — Encounter: Payer: Self-pay | Admitting: Family Medicine

## 2015-05-09 DIAGNOSIS — R0789 Other chest pain: Secondary | ICD-10-CM

## 2015-05-09 NOTE — Telephone Encounter (Signed)
Persistent chest pain with normal CT. Thoughts? PT vs another steroid course? cymbalta?

## 2015-05-10 NOTE — Telephone Encounter (Signed)
Done - i am going to have him see Vonna Kotykjay riley in [place], and i asked him to work on a few specifics  Thanks.   Electronically Signed  By: Hannah BeatSpencer Tahirih Lair, MD On: 05/10/2015 8:31 AM

## 2015-05-10 NOTE — Telephone Encounter (Signed)
can you put referral in? Don't know if you have a specific therapist in mind. Thanks

## 2015-08-30 ENCOUNTER — Encounter: Payer: Self-pay | Admitting: Family Medicine

## 2015-08-30 DIAGNOSIS — R0789 Other chest pain: Secondary | ICD-10-CM

## 2015-08-31 NOTE — Telephone Encounter (Signed)
I don't think anything else needs to be done diagnostically.   Manipulation would be my next thought, too.  I would be curious if Ian MalkinZach has other thoughts or recommendations - this is a difficult case. I would probably favor getting his opinion. I cannot think of any other specialist who I would involve.  Electronically Signed  By: Hannah BeatSpencer Shoichi Mielke, MD On: 08/31/2015 4:56 PM

## 2015-08-31 NOTE — Telephone Encounter (Signed)
recs on where to go from here? ?chiropractor vs referral to zach smith for manipulation of chest wall? Thanks -[name]

## 2015-09-02 NOTE — Telephone Encounter (Signed)
Thanks. [place] [facility] [place]. zach - let's touch base about patient when you get a chance.  thanks

## 2015-09-28 ENCOUNTER — Encounter: Payer: Self-pay | Admitting: Family Medicine

## 2015-09-28 ENCOUNTER — Ambulatory Visit (INDEPENDENT_AMBULATORY_CARE_PROVIDER_SITE_OTHER): Payer: BLUE CROSS/BLUE SHIELD | Admitting: Family Medicine

## 2015-09-28 ENCOUNTER — Other Ambulatory Visit (INDEPENDENT_AMBULATORY_CARE_PROVIDER_SITE_OTHER): Payer: BLUE CROSS/BLUE SHIELD

## 2015-09-28 VITALS — BP 130/84 | HR 70 | Ht 72.0 in | Wt 238.0 lb

## 2015-09-28 DIAGNOSIS — R079 Chest pain, unspecified: Secondary | ICD-10-CM

## 2015-09-28 DIAGNOSIS — R0789 Other chest pain: Secondary | ICD-10-CM

## 2015-09-28 DIAGNOSIS — Q676 Pectus excavatum: Secondary | ICD-10-CM | POA: Diagnosis not present

## 2015-09-28 LAB — COMPREHENSIVE METABOLIC PANEL
ALBUMIN: 4.4 g/dL (ref 3.5–5.2)
ALK PHOS: 71 U/L (ref 39–117)
ALT: 34 U/L (ref 0–53)
AST: 19 U/L (ref 0–37)
BILIRUBIN TOTAL: 0.7 mg/dL (ref 0.2–1.2)
BUN: 15 mg/dL (ref 6–23)
CO2: 32 mEq/L (ref 19–32)
CREATININE: 1.1 mg/dL (ref 0.40–1.50)
Calcium: 9.5 mg/dL (ref 8.4–10.5)
Chloride: 102 mEq/L (ref 96–112)
GFR: 80.97 mL/min (ref >60.00)
Glucose, Bld: 89 mg/dL (ref 70–99)
Potassium: 4.1 mEq/L (ref 3.5–5.1)
SODIUM: 137 meq/L (ref 135–145)
TOTAL PROTEIN: 7.5 g/dL (ref 6.0–8.3)

## 2015-09-28 LAB — CBC WITH DIFFERENTIAL/PLATELET
BASOS PCT: 0.5 % (ref 0.0–3.0)
Basophils Absolute: 0 10*3/uL (ref 0.0–0.1)
EOS ABS: 0.2 10*3/uL (ref 0.0–0.7)
Eosinophils Relative: 3.3 % (ref 0.0–5.0)
HCT: 44.7 % (ref 39.0–52.0)
HEMOGLOBIN: 15.2 g/dL (ref 13.0–17.0)
Lymphocytes Relative: 44.1 % (ref 12.0–46.0)
Lymphs Abs: 2.4 10*3/uL (ref 0.7–4.0)
MCHC: 34.1 g/dL (ref 30.0–36.0)
MCV: 88.6 fl (ref 78.0–100.0)
MONO ABS: 0.5 10*3/uL (ref 0.1–1.0)
Monocytes Relative: 9.4 % (ref 3.0–12.0)
NEUTROS PCT: 42.7 % — AB (ref 43.0–77.0)
Neutro Abs: 2.3 10*3/uL (ref 1.4–7.7)
Platelets: 268 10*3/uL (ref 150.0–400.0)
RBC: 5.05 Mil/uL (ref 4.22–5.81)
RDW: 13.6 % (ref 11.5–15.5)
WBC: 5.4 10*3/uL (ref 4.0–10.5)

## 2015-09-28 LAB — SEDIMENTATION RATE: SED RATE: 6 mm/h (ref 0–22)

## 2015-09-28 LAB — C-REACTIVE PROTEIN: CRP: 0.1 mg/dL — AB (ref 0.5–20.0)

## 2015-09-28 LAB — TSH: TSH: 1.17 u[IU]/mL (ref 0.35–4.50)

## 2015-09-28 NOTE — Progress Notes (Signed)
Pre visit review using our clinic review tool, if applicable. No additional management support is needed unless otherwise documented below in the visit note. 

## 2015-09-28 NOTE — Patient Instructions (Signed)
Good to see you  We ill get labs Starting tomorrow take nexium daily for 8 days and s ee if it helps Keep a diary of the pain and write down what you just did and what you just ate OK to lift but keep hands within peripheral vision (NO FLYS!!!!) Pennsaid finger tip amount up ot 2 times a day if hurts See me aqain in 4 weeks if not better or send me a message if you notice the nexium helped and I can get you a prescription.

## 2015-09-28 NOTE — Assessment & Plan Note (Signed)
Long history of 3 years of pain. Patient has had workup including a CT scan of his chest recently that was fairly unremarkable. No significant signs of costochondritis and very minimal pain today. Ultrasound shows the patient's sternoclavicular joints have no type of effusion and patient's sternum has no signs of any type of stress fracture. Discussed with patient at great length and patient does have a lot of anxiety about this pain. We discussed that some of this could be psychosomatic. Patient also would like the last rule out any autoimmune or thyroid disease that could be contribute in. Differential also includes SAPHO syndrome or Tietze syndrome but the likelihood. Patient was given topical anti-inflammatory to see if this will be beneficial. In addition of this patient given Nexium to see if this could be associated with any type of reflux disease. Patient will keep a journal and see if any food seems to aggravate it. Patient did have some mild upper epigastric pain today. Patient then will come back and see me again in 4 weeks.

## 2015-09-28 NOTE — Progress Notes (Signed)
[facility] [address] [place], [place] [address] Phone: [phone] Subjective:    I'O[facility] seeing this patient by the request  of:  Eustaquio Boyden, MD   CC: Sternal pain  [id number] [facility] is a 35 y.o. male coming in with complaint of sternal pain. Patient states that he is had this intermittently for 3 years. Has seen many different providers for with no significant improvement. Patient recently did see another provider and advance imaging was ordered. Patient did have a CT scan of the chest. CT of the chest showed no bony abnormality but a 4 mm nodule density at the anterior aspect of the minor fissure but is a low likelihood for any type of lung cancer. Patient states since the scan in July patient continues to have this maybe once or twice a week. Last time he noticed it is when he had his 16-month-old daughter on his chest. Denies any significant association with food. Has not really paid attention. States that it seemed to be worse with activities. Running and lifting seems to exacerbate it. Denies any nighttime awakening. Patient is just concerned as he has not responded to any type of therapy. Was given prednisone one time that didn't seem to help short lived.     Past Medical History  Diagnosis Date  . History of chicken pox    Past Surgical History  Procedure Laterality Date  . No past surgeries     Social History   Social History  . Marital Status: Single    Spouse Name: N/A  . Number of Children: N/A  . Years of Education: N/A   Social History Main Topics  . Smoking status: Never Smoker   . Smokeless tobacco: Never Used  . Alcohol Use: Yes     Comment: Occasional  . Drug Use: No  . Sexual Activity: Not Asked   Other Topics Concern  . None   Social History Narrative   Lives with wife, expecting 1st child, 1 dog    Occupation: Engineer, agricultural, receiving   Edu: master's   Activity: no regular exercise, was going to  gym   Diet: good water, fruits/vegetables daily   No Known Allergies Family History  Problem Relation Age of Onset  . Cancer Paternal Grandmother     breast  . Cancer Maternal Aunt     breast  . Cancer Paternal Uncle     unsure type  . Diabetes Neg Hx   . Hypertension Neg Hx   . CAD Neg Hx   . Stroke Neg Hx     Past medical history, social, surgical and family history all reviewed in electronic medical record.  No pertanent information unless stated regarding to the chief complaint.   Review of Systems: No headache, visual changes, nausea, vomiting, diarrhea, constipation, dizziness, abdominal pain, skin rash, fevers, chills, night sweats, weight loss, swollen lymph nodes, body aches, joint swelling, muscle aches, chest pain, shortness of breath, mood changes.   Objective Blood pressure 130/84, pulse 70, height 6' (1.829 [facility]), weight 238 lb (107.956 kg), SpO2 96 %.  General: No apparent distress alert and oriented x3 mood and affect normal, dressed appropriately.  HEENT: Pupils equal, extraocular movements intact  Respiratory: Patient's speak in full sentences and does not appear short of breath  Cardiovascular: No lower extremity edema, non tender, no erythema  Skin: Warm dry intact with no signs of infection or rash on extremities or on axial skeleton.  Abdomen: Soft mild pain  in the epigastric region Neuro: Cranial nerves II through XII are intact, neurovascularly intact in all extremities with 2+ DTRs and 2+ pulses.  Lymph: No lymphadenopathy of posterior or anterior cervical chain or axillae bilaterally.  Gait normal with good balance and coordination.  MSK:  Non tender with full range of motion and good stability and symmetric strength and tone of shoulders, elbows, wrist, hip, knee and ankles bilaterally.  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  shows patient is minimally tender diffusely over the sternal border bilaterally. No crepitus noted. Full range of motion  of the upper extremity is. Neurovascularly intact distally. Patient does have some mild pain in the epigastric region  Limited musculoskeletal ultrasound was performed and interpreted by [facility], [facility], [facility]  Patient's sternoclavicular joints were visualized and showed no effusion noted. Scanning along the sternal border showed no hypoechoic changes or increasing Doppler flow. No signs of any cortical defect. Impression: Normal ultrasound of the sternum.   Impression and Recommendations:     This case required medical decision making of moderate complexity.      Note: This dictation was prepared with Dragon dictation along with smaller phrase technology. Any transcriptional errors that result from this process are unintentional.

## 2015-09-29 LAB — CYCLIC CITRUL PEPTIDE ANTIBODY, IGG: Cyclic Citrullin Peptide Ab: <16 Units

## 2015-09-29 LAB — ANTI-DNA ANTIBODY, DOUBLE-STRANDED

## 2015-09-29 LAB — ANA: Anti Nuclear Antibody(ANA): NEGATIVE

## 2015-12-18 IMAGING — CT CT CHEST W/ CM
2 of 3 series · 14 of 31 positions shown, 16 images · IV contrast (75CC ISOVUE 300)
Comparison: Sternal radiographs 12/03/2014

CLINICAL DATA: Sternal and mid chest pain for 14-15 months

EXAM:
CT CHEST WITH CONTRAST
TECHNIQUE: Multidetector CT imaging of the chest was performed during
intravenous contrast administration.
CONTRAST:  75mL W9ED9T-ZLL IOPAMIDOL (W9ED9T-ZLL) INJECTION 61%

[Series 3: chest with · axial · 0.70mm/px · z∈[-249,-24]mm · 6 of 63 slices shown, 8 images]
[im 9/63  mediastinal]
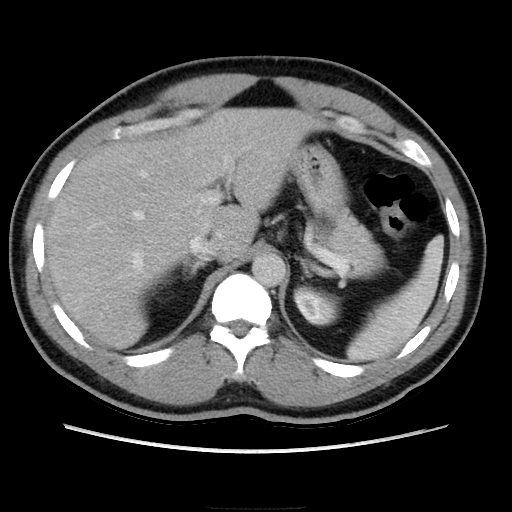
[im 9/63  lung]
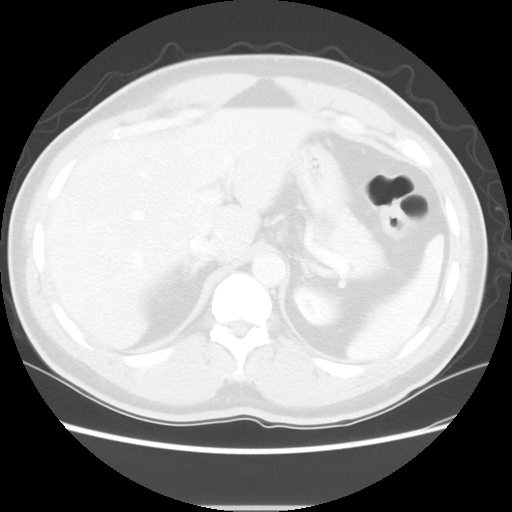
[im 18/63  lung]
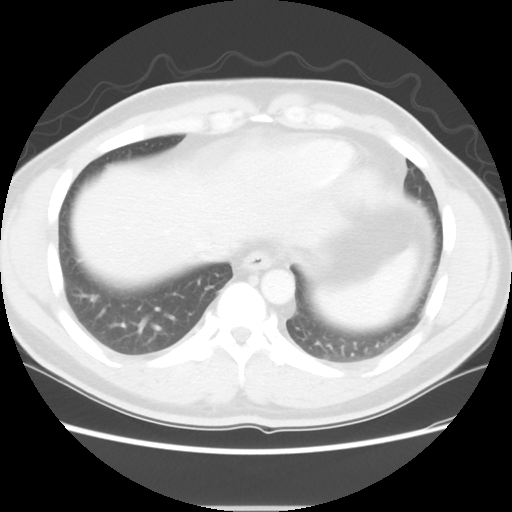
[im 27/63  lung]
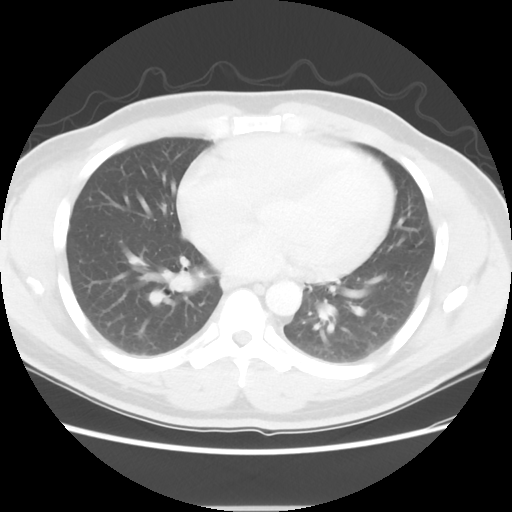
[im 36/63  lung]
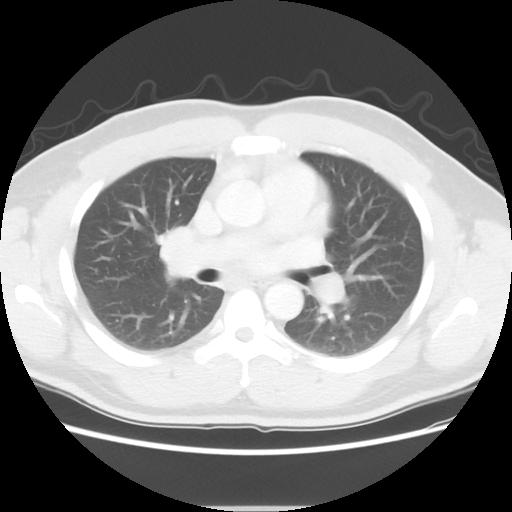
[im 45/63  mediastinal]
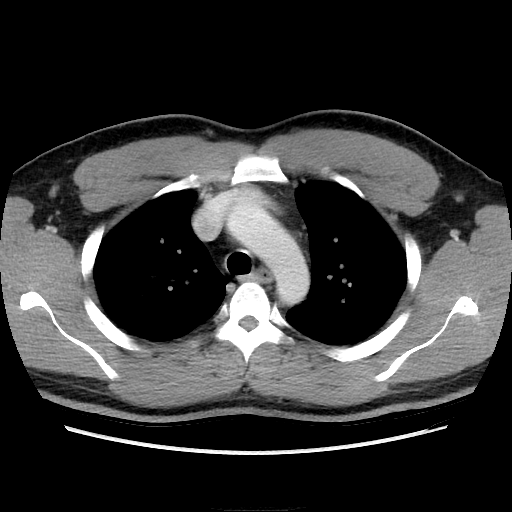
[im 45/63  lung]
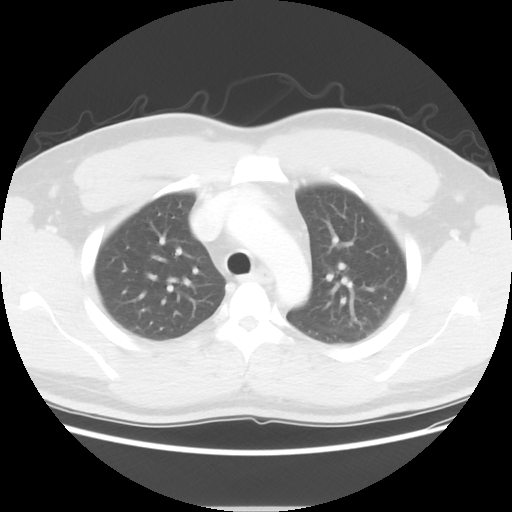
[im 54/63  lung]
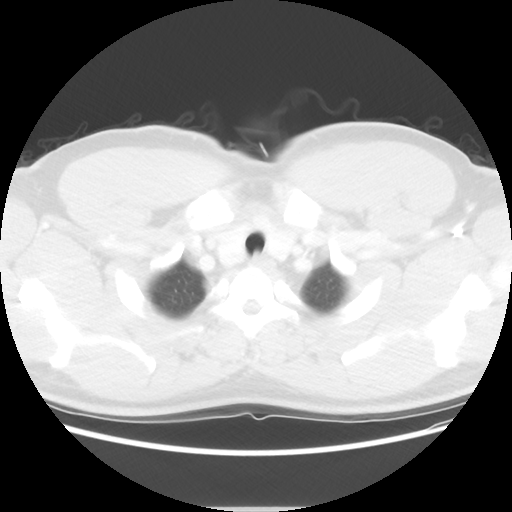

[Series 602: sagittal body · sagittal · 0.70mm/px · 8 of 145 slices shown]
[im 16/145  mediastinal]
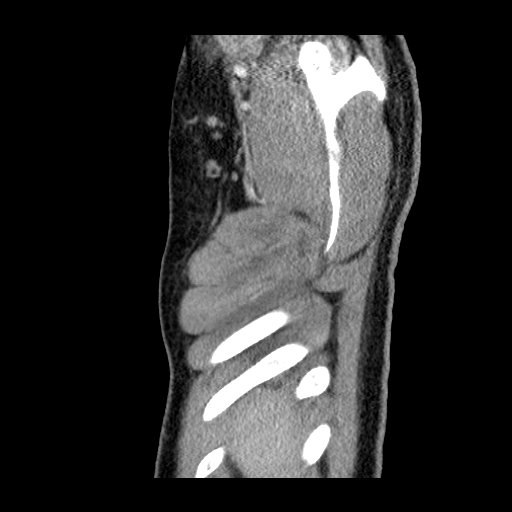
[im 31/145  mediastinal]
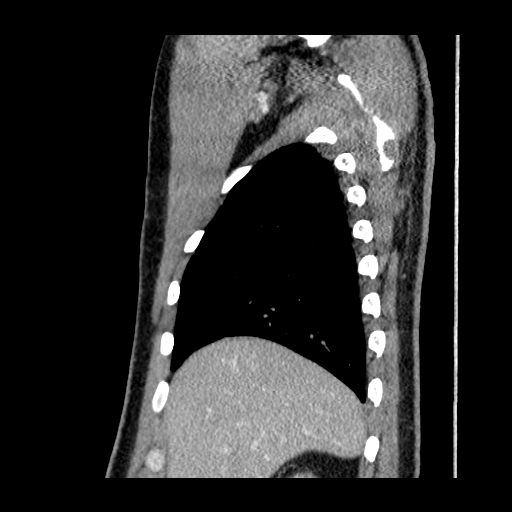
[im 46/145  mediastinal]
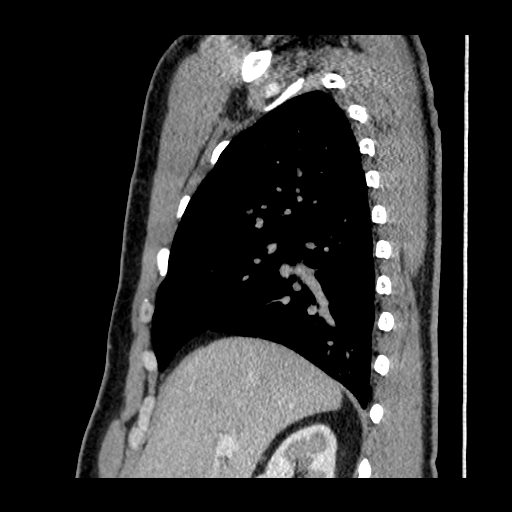
[im 69/145  mediastinal]
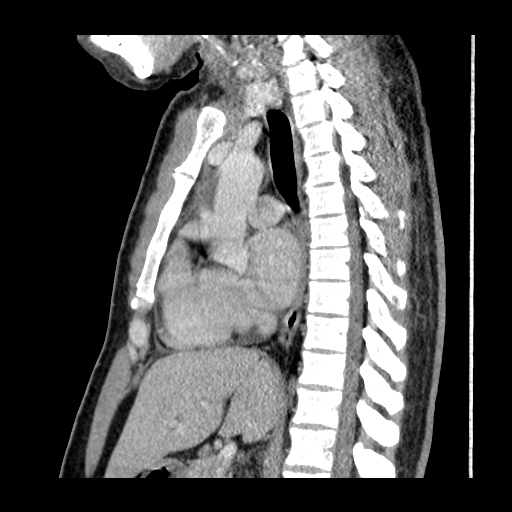
[im 76/145  mediastinal]
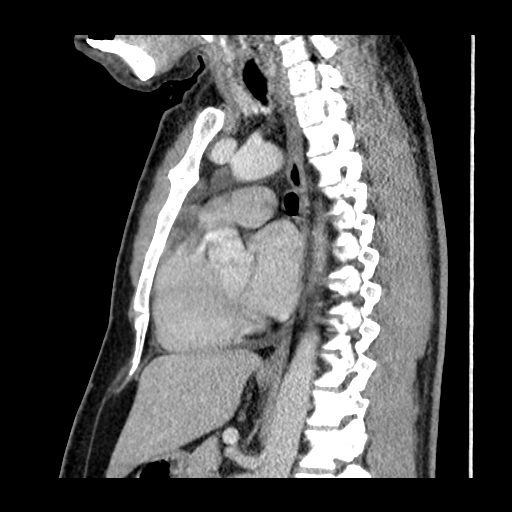
[im 99/145  mediastinal]
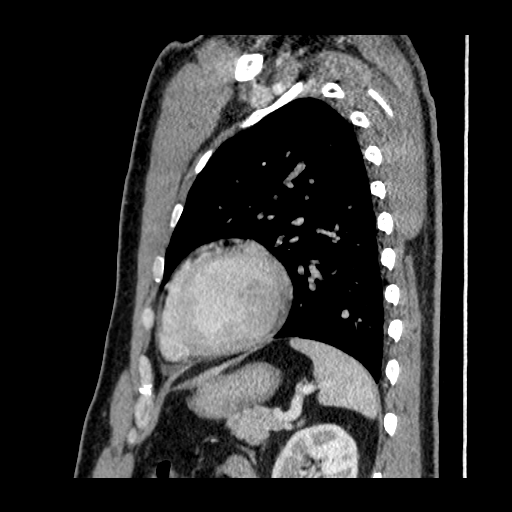
[im 114/145  mediastinal]
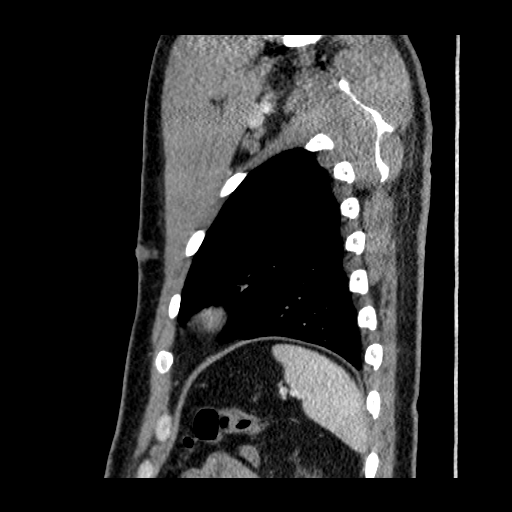
[im 129/145  mediastinal]
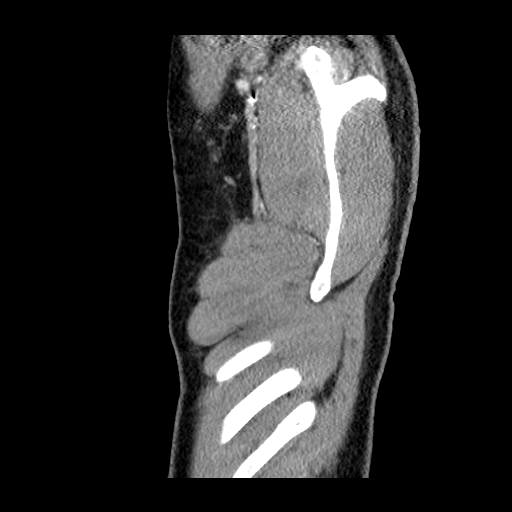

[14 of 31 positions shown; findings below may reference images not displayed]

FINDINGS: Vascular structures grossly patent on nondedicated exam.

No thoracic adenopathy or retrosternal/parasternal soft tissue
density.

Minimal BILATERAL gynecomastia.

Visualized upper abdomen unremarkable.

4 mm nodular density anterior aspect of minor fissure image 35.

No pulmonary infiltrate, pleural effusion, pneumothorax or
additional mass/nodule.

Bones unremarkable.

Specifically, sternum is normal in appearance without focal or
surrounding abnormality.
IMPRESSION: No acute sternal abnormalities.

4 mm nodular density at anterior aspect of minor fissure,
recommendation below.

If the patient is at high risk for bronchogenic carcinoma, follow-up
chest CT at 1 year is recommended. If the patient is at low risk, no
follow-up is needed. This recommendation follows the consensus
statement: Guidelines for Management of Small Pulmonary Nodules
Detected on CT Scans: A Statement from the [HOSPITAL] as

## 2016-05-24 ENCOUNTER — Encounter: Payer: Self-pay | Admitting: Family Medicine

## 2022-08-17 ENCOUNTER — Ambulatory Visit: Payer: Self-pay

## 2022-08-17 ENCOUNTER — Ambulatory Visit
Admission: RE | Admit: 2022-08-17 | Discharge: 2022-08-17 | Disposition: A | Discharge status: Home / Self Care | Payer: BLUE CROSS/BLUE SHIELD | Source: Ambulatory Visit | Attending: Family Medicine | Admitting: Family Medicine

## 2022-08-17 VITALS — BP 137/89 | HR 93 | Temp 99.6°F | Ht 73.0 in | Wt 245.0 lb

## 2022-08-17 DIAGNOSIS — R509 Fever, unspecified: Secondary | ICD-10-CM

## 2022-08-17 DIAGNOSIS — R6889 Other general symptoms and signs: Secondary | ICD-10-CM

## 2022-08-17 LAB — GROUP A STREP BY PCR: Group A Strep by PCR: NOT DETECTED

## 2022-08-17 MED ORDER — PROMETHAZINE-DM 6.25-15 MG/5ML PO SYRP: 5.0000 mL | ORAL_SOLUTION | Freq: Four times a day (QID) | ORAL | 0 refills | Status: AC | PRN

## 2022-08-17 NOTE — ED Provider Notes (Signed)
[facility]    CSN: [id number] Arrival date & time: 08/17/22  1744      History   Chief Complaint Chief Complaint  Patient presents with   Cough   Nasal Congestion    HPI [facility] is a 42 y.o. male.   HPI   [name] presents for cough, chills, fever, rhinorrhea and nasal congestion since Sunday. He has sore throat. His family was sick but they got better in like 2 days. He has persistent cough, headache and rhinorrhea. Had a fever last night. Took aspirin, Theraflu, Mucinex and Col-Flu gels with some relief.  He had vomiting yesterday.  No diarrhea. No current nausea.    Past Medical History:  Diagnosis Date   History of chicken pox     Patient Active Problem List   Diagnosis Date Noted   Pectus excavatum 09/28/2015   Headache(784.0) 11/19/2013   Sternum pain 06/03/2013   Healthcare maintenance 12/25/2012    Past Surgical History:  Procedure Laterality Date   NO PAST SURGERIES         Home Medications    Prior to Admission medications   Medication Sig Start Date End Date Taking? Authorizing Provider  irbesartan-hydrochlorothiazide (AVALIDE) 150-12.5 MG tablet Take 1 tablet by mouth daily.   Yes [provider]  montelukast (SINGULAIR) 10 MG tablet TAKE 1 TABLET(10 MG) BY MOUTH DAILY 07/10/22  Yes [provider]  promethazine-dextromethorphan (PROMETHAZINE-DM) 6.25-15 MG/5ML syrup Take 5 mLs by mouth 4 (four) times daily as needed. 08/17/22  Yes Lyndee Hensen, DO    Family History Family History  Problem Relation Age of Onset   Cancer Paternal Grandmother        breast   Cancer Maternal Aunt        breast   Cancer Paternal Uncle        unsure type   Diabetes Neg Hx    Hypertension Neg Hx    CAD Neg Hx    Stroke Neg Hx     Social History Social History   Tobacco Use   Smoking status: Never   Smokeless tobacco: Never  Vaping Use   Vaping Use: Never used  Substance Use Topics   Alcohol use: Yes    Comment:  Occasional   Drug use: No     Allergies   Patient has no known allergies.   Review of Systems Review of Systems: negative unless otherwise stated in HPI.      Physical Exam Triage Vital Signs ED Triage Vitals  Enc Vitals Group     BP 08/17/22 1809 137/89     Pulse Rate 08/17/22 1809 93     Resp --      Temp --      Temp Source 08/17/22 1809 Oral     SpO2 08/17/22 1809 97 %     Weight 08/17/22 1808 245 lb (111.1 kg)     Height 08/17/22 1808 '6\' 1"'$  (1.854 m)     Head Circumference --      Peak Flow --      Pain Score 08/17/22 1808 0     Pain Loc --      Pain Edu? --      Excl. in [place]? --    No data found.  Updated Vital Signs BP 137/89 (BP Location: Left Arm)   Pulse 93   Temp 99.6 F (37.6 C) (Oral)   Ht '6\' 1"'$  (1.854 m)   Wt 111.1 kg   SpO2 97%  BMI 32.32 kg/m   Visual Acuity Right Eye Distance:   Left Eye Distance:   Bilateral Distance:    Right Eye Near:   Left Eye Near:    Bilateral Near:     Physical Exam GEN:     alert, non-toxic appearing male in no distress    HENT:  mucus membranes moist, oropharyngeal without lesions or exudate, no tonsillar hypertrophy, mild oropharyngeal erythema, clear nasal discharge EYES:   pupils equal and reactive, no scleral injection or discharge NECK:  normal ROM, no lymphadenopathy, no meningismus   RESP:  no increased work of breathing, clear to auscultation bilaterally CVS:   regular rate and rhythm Skin:   warm and dry, no rash on visible skin    UC Treatments / Results  Labs (all labs ordered are listed, but only abnormal results are displayed) Labs Reviewed  GROUP A STREP BY PCR    EKG   Radiology No results found.  Procedures Procedures (including critical care time)  Medications Ordered in UC Medications - No data to display  Initial Impression / Assessment and Plan / UC Course  I have reviewed the triage vital signs and the nursing notes.  Pertinent labs & imaging results that were  available during my care of the patient were reviewed by me and considered in my medical decision making (see chart for details).       Pt is a 42 y.o. male who presents for 6 days of flu-like symptoms. Quang is afebrile here. Satting well on room air. Overall pt is non-toxic appearing, well hydrated, without respiratory distress. Pulmonary exam is unremarkable.  COVID and influenza testing deferred due to duration of symptoms.  Strep PCR is negative.  History most consistent with viral respiratory illness. Discussed symptomatic treatment.  Explained lack of efficacy of antibiotics in viral disease.  Typical duration of symptoms discussed.  Promethazine DM prescribed for cough.  Return and ED precautions given and voiced understanding. Discussed MDM, treatment plan and plan for follow-up with patient who agrees with plan.     Final Clinical Impressions(s) / UC Diagnoses   Final diagnoses:  Flu-like symptoms  Fever, unspecified     Discharge Instructions      I suspect you have the flu.  Your strep test is negative. Stop by the pharmacy to pick up your cough syrup. You can take Tylenol and/or Ibuprofen as needed for fever reduction and pain relief.    For cough: honey 1/2 to 1 teaspoon (you can dilute the honey in water or another fluid).  You can also use guaifenesin and dextromethorphan for cough. You can use a humidifier for chest congestion and cough.  If you don't have a humidifier, you can sit in the bathroom with the hot shower running.      For sore throat: try warm salt water gargles, Mucinex sore throat cough drops or cepacol lozenges, throat spray, warm tea or water with lemon/honey, popsicles or ice, or OTC cold relief medicine for throat discomfort. You can also purchase chloraseptic spray at the pharmacy or dollar store.   For congestion: take a daily anti-histamine like Zyrtec, Claritin, and a oral decongestant, such as pseudoephedrine.  You can also use Xlear nasal sprays  in each nostril daily.   It is important to stay hydrated: drink plenty of fluids (water, gatorade/powerade/pedialyte, juices, or teas) to keep your throat moisturized and help further relieve irritation/discomfort.    Return or go to the Emergency Department if symptoms worsen or do  not improve in the next few days      ED Prescriptions     Medication Sig Dispense Auth. Provider   promethazine-dextromethorphan (PROMETHAZINE-DM) 6.25-15 MG/5ML syrup Take 5 mLs by mouth 4 (four) times daily as needed. 118 mL Graysyn Bache, Ronnette Juniper, DO      I have reviewed the PDMP during this encounter.   Lyndee Hensen, DO 08/17/22 1903

## 2022-08-17 NOTE — ED Triage Notes (Signed)
Pt c/o cough, body chills, temperature of 101, vomiting, nasal congestion x6days  Pt states that his symptoms worsened at night and he has taken home covid test and they are negative.

## 2022-08-17 NOTE — Discharge Instructions (Addendum)
I suspect you have the flu.  Your strep test is negative. Stop by the pharmacy to pick up your cough syrup. You can take Tylenol and/or Ibuprofen as needed for fever reduction and pain relief.    For cough: honey 1/2 to 1 teaspoon (you can dilute the honey in water or another fluid).  You can also use guaifenesin and dextromethorphan for cough. You can use a humidifier for chest congestion and cough.  If you don't have a humidifier, you can sit in the bathroom with the hot shower running.      For sore throat: try warm salt water gargles, Mucinex sore throat cough drops or cepacol lozenges, throat spray, warm tea or water with lemon/honey, popsicles or ice, or OTC cold relief medicine for throat discomfort. You can also purchase chloraseptic spray at the pharmacy or dollar store.   For congestion: take a daily anti-histamine like Zyrtec, Claritin, and a oral decongestant, such as pseudoephedrine.  You can also use Xlear nasal sprays in each nostril daily.   It is important to stay hydrated: drink plenty of fluids (water, gatorade/powerade/pedialyte, juices, or teas) to keep your throat moisturized and help further relieve irritation/discomfort.    Return or go to the Emergency Department if symptoms worsen or do not improve in the next few days
